# Patient Record
Sex: Female | Born: 1985 | Race: Black or African American | Hispanic: No | Marital: Married | Smoking: Former smoker
Health system: Southern US, Community
[De-identification: ages and names within clinical notes are randomized; demographics above are authoritative.]

## PROBLEM LIST (undated history)

## (undated) ENCOUNTER — Inpatient Hospital Stay (HOSPITAL_COMMUNITY): Payer: Self-pay

## (undated) HISTORY — PX: TYMPANOSTOMY TUBE PLACEMENT: SHX32

## (undated) HISTORY — PX: OTHER SURGICAL HISTORY: SHX169

## (undated) HISTORY — PX: REVISION OF SCAR ON FACE/HEAD: SHX2349

## (undated) HISTORY — PX: TONSILLECTOMY: SUR1361

---

## 2013-06-15 ENCOUNTER — Other Ambulatory Visit (HOSPITAL_COMMUNITY): Payer: Self-pay | Admitting: Physician Assistant

## 2013-06-15 DIAGNOSIS — Z3689 Encounter for other specified antenatal screening: Secondary | ICD-10-CM

## 2013-06-15 LAB — OB RESULTS CONSOLE ANTIBODY SCREEN: Antibody Screen: NEGATIVE

## 2013-06-15 LAB — OB RESULTS CONSOLE RPR: RPR: NONREACTIVE

## 2013-06-15 LAB — OB RESULTS CONSOLE RUBELLA ANTIBODY, IGM: Rubella: IMMUNE

## 2013-06-16 ENCOUNTER — Ambulatory Visit (HOSPITAL_COMMUNITY)
Admission: RE | Admit: 2013-06-16 | Discharge: 2013-06-16 | Disposition: A | Discharge status: Home / Self Care | Payer: Medicaid Other | Source: Ambulatory Visit | Attending: Physician Assistant | Admitting: Physician Assistant

## 2013-06-16 ENCOUNTER — Encounter (HOSPITAL_COMMUNITY): Payer: Self-pay

## 2013-06-16 DIAGNOSIS — Z1389 Encounter for screening for other disorder: Secondary | ICD-10-CM | POA: Insufficient documentation

## 2013-06-16 DIAGNOSIS — Z3689 Encounter for other specified antenatal screening: Secondary | ICD-10-CM

## 2013-06-16 DIAGNOSIS — Z363 Encounter for antenatal screening for malformations: Secondary | ICD-10-CM | POA: Insufficient documentation

## 2013-06-16 DIAGNOSIS — O358XX0 Maternal care for other (suspected) fetal abnormality and damage, not applicable or unspecified: Secondary | ICD-10-CM | POA: Insufficient documentation

## 2013-06-16 DIAGNOSIS — Z8751 Personal history of pre-term labor: Secondary | ICD-10-CM | POA: Insufficient documentation

## 2013-07-09 LAB — OB RESULTS CONSOLE GC/CHLAMYDIA
Chlamydia: NEGATIVE
Gonorrhea: NEGATIVE

## 2013-07-14 ENCOUNTER — Inpatient Hospital Stay (HOSPITAL_COMMUNITY)
Admission: AD | Admit: 2013-07-14 | Discharge: 2013-07-15 | Disposition: A | Discharge status: Home / Self Care | Payer: Medicaid Other | Source: Ambulatory Visit | Attending: Obstetrics & Gynecology | Admitting: Obstetrics & Gynecology

## 2013-07-14 ENCOUNTER — Emergency Department (HOSPITAL_COMMUNITY)
Admission: EM | Admit: 2013-07-14 | Discharge: 2013-07-14 | Discharge status: Against Medical Advice | Payer: Medicaid Other | Attending: Emergency Medicine | Admitting: Emergency Medicine

## 2013-07-14 ENCOUNTER — Encounter (HOSPITAL_COMMUNITY): Payer: Self-pay | Admitting: *Deleted

## 2013-07-14 DIAGNOSIS — R109 Unspecified abdominal pain: Secondary | ICD-10-CM | POA: Insufficient documentation

## 2013-07-14 DIAGNOSIS — R070 Pain in throat: Secondary | ICD-10-CM | POA: Insufficient documentation

## 2013-07-14 DIAGNOSIS — R112 Nausea with vomiting, unspecified: Secondary | ICD-10-CM

## 2013-07-14 DIAGNOSIS — Z5900 Homelessness unspecified: Secondary | ICD-10-CM | POA: Insufficient documentation

## 2013-07-14 DIAGNOSIS — O212 Late vomiting of pregnancy: Secondary | ICD-10-CM | POA: Insufficient documentation

## 2013-07-14 DIAGNOSIS — Z87891 Personal history of nicotine dependence: Secondary | ICD-10-CM | POA: Insufficient documentation

## 2013-07-14 NOTE — MAU Note (Signed)
PT SAY SHE GETS [facility] AT [facility] .  PT IS HOMELESS-  SLEEPS AT A HOTEL.    SHE WENT TO [place]    TO SIT IN LOBBY. AT 6 PM     ATE SANDWICH AT 1PM  AT [facility]  -  SHE FELT HOT, DRANK SOME WATER.     AT 3 PM AT LIBRARY SHE VOMITED  AND HAD UPPER ABD PAIN.   NO VAG BLEEDING.    SAYS HER THROAT AND HEAD HURTS.  DENIES UC'S.     VE  AT [facility] ON AUG- CLOSED .  DENIES HSV  AND MRSA.   SAYS GBS- NEG

## 2013-07-14 NOTE — ED Notes (Signed)
Pt c/o upper abd pain, nausea, and vomiting starting today after eating a sandwich from a homeless shelter. Pt is [redacted] weeks pregnant. Pt reports she has received prenatal care. Pt is refusing to see a female patient.

## 2013-07-14 NOTE — ED Notes (Addendum)
Pt requesting female staff only, they are refusing to speak to any female staff. They refused to allow Eston Esters RN to triage patient, they did allow Amedeo Gory EMT to obtain vital signs but informed him he was not speak to her. The spouse stated he allowed Thayer Ohm to do her vital signs but refused for him to touch her any other way.  They requested to be transferred to [facility], I informed pt we may transfer her but it would be up to a Dr. To make that decision.  Pt refused to be seen unless we could guarantee a female staff. I informed pt we had female PA's working tonight and we would be more than happy for the female PA to evaluate and treat pt.  Pt wanted to know why we didn't have all female Dr.'s, spouse stated "In [place] country we have all female Dr.'s to treat our women, Men are not allowed in the room, to look at our women to speak to our women, or to touch our women."  Pt again informed we would make every effort to treat pt by all female staff members to the best of our ability. Pt refused to stay because we didn't have female dr.'s working, she didn't just want a female PA.

## 2013-07-14 NOTE — MAU Note (Signed)
CALLED PT TO TRIAGE- SOMEONE IN LOBBY SAID SHE WAS IN B-ROOM IN LOBBY

## 2013-07-15 DIAGNOSIS — R112 Nausea with vomiting, unspecified: Secondary | ICD-10-CM

## 2013-07-15 MED ORDER — MENTHOL 3 MG MT LOZG: 1.0000 | LOZENGE | OROMUCOSAL | Status: DC | PRN

## 2013-07-15 MED ORDER — ONDANSETRON 8 MG PO TBDP
8.0000 mg | ORAL_TABLET | Freq: Once | ORAL | Status: AC
  Administered 2013-07-15: 8 mg via ORAL
  Filled 2013-07-15: qty 1

## 2013-07-15 MED ORDER — ONDANSETRON HCL 4 MG PO TABS: 8.0000 mg | ORAL_TABLET | Freq: Once | ORAL | Status: DC

## 2013-07-15 NOTE — MAU Provider Note (Signed)
  History     CSN: [id number]  Arrival date and time: 07/14/13 2304   None     No chief complaint on file.  HPI  Lacey Schneider is a 27 y.o. [phone] at [redacted]w[redacted]d who presents complaining of nausea and vomiting. States she ate a large [place] sandwich around lunchtime and afterwards started to feel unwell. Felt sweaty, nauseated. She vomited 5 times. Had some epigastric pain but does not have it currently. Since coming to the MAU has eaten one potato chip, which she has kept down. Last vomit was 2-3 hours ago. Complains of throat pain from having vomited.  Denies cramping/ctx, fluid leaking, vaginal bleeding, or decreased fetal movement.    OB History   Grav Para Term Preterm Abortions TAB SAB Ect Mult Living   4 3 2 1      3      History reviewed. No pertinent past medical history.  Past Surgical History  Procedure Laterality Date  . Tonsillectomy    . Addnoids    . Tympanostomy tube placement    . Revision of scar on face/head      History reviewed. No pertinent family history.  History  Substance Use Topics  . Smoking status: Former Games developer  . Smokeless tobacco: Not on file  . Alcohol Use: No    Allergies: No Known Allergies  Prescriptions prior to admission  Medication Sig Dispense Refill  . Multiple Vitamin (MULTIVITAMIN) tablet Take 1 tablet by mouth daily.        ROS  Denies cramping/ctx, fluid leaking, vaginal bleeding, or decreased fetal movement.   Physical Exam   Blood pressure 118/61, pulse 90, temperature 97.9 F (36.6 C), temperature source Oral, height 5\' 3"  (1.6 m), weight 222 lb (100.699 kg).  Physical Exam Gen: NAD HEENT: MMM Heart: RRR Lungs: CTAB, NWOB Abd: gravid but otherwise soft, nontender to palpation Ext: no appreciable lower extremity edema bilaterally Neuro: grossly nonfocal, speech intact  MAU Course  Procedures  Urine dipstick ordered but not done due to small volume of urine given by patient (had just urinated prior to giving  sample).  Given zofran ODT 8mg  x1, feeling better, less nauseated. Able to keep sprite down.  Assessment and Plan   Lacey Schneider is a 27 y.o. [phone] at [redacted]w[redacted]d who presents with nausea and vomiting after eating a sandwich earlier. Currently afebrile, normal vital signs. Tolerated PO challenge in MAU. Symptoms improved with zofran ODT. Will give Cepacol for throat pain. Stable for discharge at this time.  Levert Feinstein, MD Family Medicine PGY-2 07/15/2013, 2:13 AM   I have seen and examined this patient and I agree with the above. FHR 130-140s +accels, no decels. Rare ctx on toco without laborlike complaints. Cam Hai 7:18 AM 07/15/2013

## 2013-07-17 NOTE — MAU Provider Note (Signed)

## 2013-08-06 ENCOUNTER — Other Ambulatory Visit (HOSPITAL_COMMUNITY): Payer: Self-pay | Admitting: Physician Assistant

## 2013-08-06 DIAGNOSIS — O480 Post-term pregnancy: Secondary | ICD-10-CM

## 2013-08-09 ENCOUNTER — Encounter (HOSPITAL_COMMUNITY): Payer: Self-pay | Admitting: *Deleted

## 2013-08-09 ENCOUNTER — Inpatient Hospital Stay (HOSPITAL_COMMUNITY)
Admission: AD | Admit: 2013-08-09 | Discharge: 2013-08-13 | DRG: 765 | Disposition: A | Discharge status: Home / Self Care | Payer: Medicaid Other | Source: Ambulatory Visit | Attending: Obstetrics & Gynecology | Admitting: Obstetrics & Gynecology

## 2013-08-09 DIAGNOSIS — O480 Post-term pregnancy: Principal | ICD-10-CM | POA: Diagnosis present

## 2013-08-09 DIAGNOSIS — O411290 Chorioamnionitis, unspecified trimester, not applicable or unspecified: Secondary | ICD-10-CM | POA: Diagnosis not present

## 2013-08-09 DIAGNOSIS — O411090 Infection of amniotic sac and membranes, unspecified, unspecified trimester, not applicable or unspecified: Secondary | ICD-10-CM | POA: Diagnosis present

## 2013-08-09 LAB — CBC
Hemoglobin: 12.5 g/dL (ref 12.0–15.0)
MCV: 77.1 fL — ABNORMAL LOW (ref 78.0–100.0)
Platelets: 257 10*3/uL (ref 150–400)
RBC: 4.58 MIL/uL (ref 3.87–5.11)
WBC: 6.1 10*3/uL (ref 4.0–10.5)

## 2013-08-09 MED ORDER — IBUPROFEN 600 MG PO TABS: 600.0000 mg | ORAL_TABLET | Freq: Four times a day (QID) | ORAL | Status: DC | PRN

## 2013-08-09 MED ORDER — LIDOCAINE HCL (PF) 1 % IJ SOLN
30.0000 mL | INTRAMUSCULAR | Status: DC | PRN
  Filled 2013-08-09: qty 30

## 2013-08-09 MED ORDER — OXYTOCIN BOLUS FROM INFUSION: 500.0000 mL | INTRAVENOUS | Status: DC

## 2013-08-09 MED ORDER — FENTANYL CITRATE 0.05 MG/ML IJ SOLN
100.0000 ug | INTRAMUSCULAR | Status: DC | PRN
  Administered 2013-08-10 (×8): 100 ug via INTRAVENOUS
  Filled 2013-08-09 (×8): qty 2

## 2013-08-09 MED ORDER — FENTANYL BOLUS VIA INFUSION: 100.0000 ug | INTRAVENOUS | Status: DC | PRN

## 2013-08-09 MED ORDER — ONDANSETRON HCL 4 MG/2ML IJ SOLN: 4.0000 mg | Freq: Four times a day (QID) | INTRAMUSCULAR | Status: DC | PRN

## 2013-08-09 MED ORDER — ACETAMINOPHEN 325 MG PO TABS
650.0000 mg | ORAL_TABLET | ORAL | Status: DC | PRN
  Administered 2013-08-10: 650 mg via ORAL
  Filled 2013-08-09: qty 2

## 2013-08-09 MED ORDER — OXYTOCIN 40 UNITS IN LACTATED RINGERS INFUSION - SIMPLE MED: 62.5000 mL/h | INTRAVENOUS | Status: DC

## 2013-08-09 MED ORDER — LACTATED RINGERS IV SOLN
INTRAVENOUS | Status: DC
  Administered 2013-08-09 – 2013-08-10 (×3): via INTRAVENOUS
  Administered 2013-08-10 (×2): 125 mL/h via INTRAVENOUS
  Administered 2013-08-10: 21:00:00 via INTRAVENOUS

## 2013-08-09 MED ORDER — CITRIC ACID-SODIUM CITRATE 334-500 MG/5ML PO SOLN
30.0000 mL | ORAL | Status: DC | PRN
  Administered 2013-08-10: 30 mL via ORAL
  Filled 2013-08-09: qty 15

## 2013-08-09 MED ORDER — LACTATED RINGERS IV SOLN
500.0000 mL | INTRAVENOUS | Status: DC | PRN
  Administered 2013-08-10: 1000 mL via INTRAVENOUS

## 2013-08-09 MED ORDER — OXYCODONE-ACETAMINOPHEN 5-325 MG PO TABS: 1.0000 | ORAL_TABLET | ORAL | Status: DC | PRN

## 2013-08-09 MED ORDER — FLEET ENEMA 7-19 GM/118ML RE ENEM: 1.0000 | ENEMA | RECTAL | Status: DC | PRN

## 2013-08-09 NOTE — Progress Notes (Signed)
Lacey Schneider is a 27 y.o. [phone] at [redacted]w[redacted]d admitted for induction of labor due to Post dates. Due date 08/01/2013.  Subjective: Doing well. Feeling some contractions but they are mild in intensity. Husband in the room.   Objective: BP 113/69   Pulse 95   Temp(Src) 99.1 F (37.3 C) (Oral)   Resp 18   Ht 5\' 3"  (1.6 m)   Wt 98.884 kg (218 lb)   BMI 38.63 kg/m2     FHT:  FHR: 130 bpm, variability: moderate,  accelerations:  Present,  decelerations:  Absent UC:   irregular, every 5-8 minutes SVE:   Dilation: 1 Effacement (%): 50 Station: -3 Exam by:: V. Irving  Labs: No results found for this basename: WBC, HGB, HCT, MCV, PLT  See H&P - routine OB labs normal  Assessment / Plan: Induction of labor due to postterm,  progressing well on pitocin  Labor: augmenting with foley bulb (placed) Preeclampsia:  n/a Fetal Wellbeing:  Category I Pain Control:  Labor support without medications and Fentanyl I/D:  n/a Anticipated MOD:  NSVD  Lacey Schneider 08/09/2013, 9:12 PM

## 2013-08-09 NOTE — H&P (Signed)
Lacey Schneider is a 27 y.o. female [phone] with IUP at [redacted]w[redacted]d by unsure LMP c/w 32 week [place] presenting for r/o labor. Pt states has been having regular contractions since 9pm last night. Have worsened in the last 4 hours.  No VB, LOF. +FM.    PNCare at [facility]. Has been uncomplicated thus far. GBS neg. Normal anatomy scan. Normal 1 hour glucola.   Prenatal History/Complications:  Past Medical History: No past medical history on file.  Past Surgical History: Past Surgical History  Procedure Laterality Date  . Tonsillectomy    . Addnoids    . Tympanostomy tube placement    . Revision of scar on face/head      Obstetrical History: OB History   Grav Para Term Preterm Abortions TAB SAB Ect Mult Living   4 3 2 1      3        Social History: History   Social History  . Marital Status: Married    Spouse Name: N/A    Number of Children: N/A  . Years of Education: N/A   Social History Main Topics  . Smoking status: Former Games developer  . Smokeless tobacco: Never Used  . Alcohol Use: No  . Drug Use: No  . Sexual Activity: Yes    Birth Control/ Protection: None   Other Topics Concern  . Not on file   Social History Narrative  . No narrative on file    Family History: No family history on file.  Allergies: No Known Allergies  Prescriptions prior to admission  Medication Sig Dispense Refill  . menthol-cetylpyridinium (CEPACOL) 3 MG lozenge Take 1 lozenge (3 mg total) by mouth as needed.      . Multiple Vitamin (MULTIVITAMIN) tablet Take 1 tablet by mouth daily.         Review of Systems   Constitutional: Negative for fever, chills, weight loss, malaise/fatigue and diaphoresis.  HENT: Negative for hearing loss, ear pain, nosebleeds, congestion, sore throat, neck pain, tinnitus and ear discharge.   Eyes: Negative for blurred vision, double vision, photophobia, pain, discharge and redness.  Respiratory: Negative for cough, hemoptysis, sputum production, shortness of breath,  wheezing and stridor.   Cardiovascular: Negative for chest pain, palpitations, orthopnea,  leg swelling  Gastrointestinal: Positive for abdominal pain. Negative for heartburn, nausea, vomiting, diarrhea, constipation, blood in stool Genitourinary: Negative for dysuria, urgency, frequency, hematuria and flank pain.  Musculoskeletal: Negative for myalgias, back pain, joint pain and falls.  Skin: Negative for itching and rash.  Neurological: Negative for dizziness, tingling, tremors, sensory change, speech change, focal weakness, seizures, loss of consciousness, weakness and headaches.  Endo/Heme/Allergies: Negative for environmental allergies and polydipsia. Does not bruise/bleed easily.  Psychiatric/Behavioral: Negative for depression, suicidal ideas, hallucinations, memory loss and substance abuse. The patient is not nervous/anxious and does not have insomnia.       Blood pressure 113/69, pulse 95, temperature 99.1 F (37.3 C), temperature source Oral, resp. rate 18, height 5\' 3"  (1.6 m), weight 98.884 kg (218 lb). General appearance: alert, cooperative and no distress Lungs: clear to auscultation bilaterally Heart: regular rate and rhythm Abdomen: soft, non-tender; bowel sounds normal Pelvic: 1.5/50/-3 Extremities: Homans sign is negative, no sign of DVT DTR's 2+  Presentation: cephalic confirmed on US Fetal monitoringBaseline: 150 bpm, Variability: Good {> 6 bpm), Accelerations: Reactive and Decelerations: Absent Uterine activity every 3-29min  Dilation: 1.5 Effacement (%): 50 Station: -3 Exam by:: Dr. Reola Calkins   Prenatal labs: ABO, Rh: O/Positive/-- (07/21 0000) Antibody: Negative (  07/21 0000) Rubella:   RPR: Nonreactive (07/21 0000)  HBsAg: Negative (07/21 0000)  HIV: Non-reactive (07/21 0000)  GBS: Negative (08/14 0000)  1 hr Glucola nml Genetic screening  Too late Anatomy US normal    Assessment: Lacey Schneider is a 27 y.o. [phone] with an IUP at [redacted]w[redacted]d presenting for r/o  labor at postdates.   Plan: 1) admit to L&D - routine orders - plan to augment with foley bulb and pit - epidural prn  2) FWB - cat I tracing - GBS neg - EFW 6#  3) anticipate SVD   Lacey Schneider L, MD 08/09/2013, 7:30 PM

## 2013-08-10 ENCOUNTER — Ambulatory Visit (HOSPITAL_COMMUNITY): Admission: RE | Admit: 2013-08-10 | Payer: Medicaid Other | Source: Ambulatory Visit

## 2013-08-10 ENCOUNTER — Encounter (HOSPITAL_COMMUNITY): Payer: Self-pay

## 2013-08-10 ENCOUNTER — Inpatient Hospital Stay (HOSPITAL_COMMUNITY): Payer: Medicaid Other | Admitting: Anesthesiology

## 2013-08-10 ENCOUNTER — Encounter (HOSPITAL_COMMUNITY)
Admission: AD | Disposition: A | Discharge status: Home / Self Care | Payer: Self-pay | Source: Ambulatory Visit | Attending: Obstetrics & Gynecology

## 2013-08-10 ENCOUNTER — Encounter (HOSPITAL_COMMUNITY): Payer: Self-pay | Admitting: Anesthesiology

## 2013-08-10 DIAGNOSIS — O480 Post-term pregnancy: Secondary | ICD-10-CM

## 2013-08-10 DIAGNOSIS — O411290 Chorioamnionitis, unspecified trimester, not applicable or unspecified: Secondary | ICD-10-CM | POA: Diagnosis not present

## 2013-08-10 DIAGNOSIS — O411090 Infection of amniotic sac and membranes, unspecified, unspecified trimester, not applicable or unspecified: Secondary | ICD-10-CM

## 2013-08-10 LAB — ABO/RH: ABO/RH(D): O POS

## 2013-08-10 LAB — TYPE AND SCREEN: ABO/RH(D): O POS

## 2013-08-10 LAB — RAPID URINE DRUG SCREEN, HOSP PERFORMED: Benzodiazepines: NOT DETECTED

## 2013-08-10 SURGERY — Surgical Case
Anesthesia: Spinal | Site: Abdomen | Wound class: Clean Contaminated

## 2013-08-10 MED ORDER — SODIUM CHLORIDE 0.9 % IV SOLN
2.0000 g | Freq: Four times a day (QID) | INTRAVENOUS | Status: DC
  Administered 2013-08-10 (×2): 2 g via INTRAVENOUS
  Filled 2013-08-10 (×5): qty 2000

## 2013-08-10 MED ORDER — ONDANSETRON HCL 4 MG/2ML IJ SOLN
INTRAMUSCULAR | Status: AC
  Filled 2013-08-10: qty 2

## 2013-08-10 MED ORDER — FENTANYL CITRATE 0.05 MG/ML IJ SOLN
25.0000 ug | INTRAMUSCULAR | Status: DC | PRN
  Administered 2013-08-10 – 2013-08-11 (×4): 50 ug via INTRAVENOUS

## 2013-08-10 MED ORDER — DIPHENHYDRAMINE HCL 50 MG/ML IJ SOLN: 12.5000 mg | INTRAMUSCULAR | Status: DC | PRN

## 2013-08-10 MED ORDER — KETOROLAC TROMETHAMINE 30 MG/ML IJ SOLN
INTRAMUSCULAR | Status: AC
  Filled 2013-08-10: qty 1

## 2013-08-10 MED ORDER — ONDANSETRON HCL 4 MG/2ML IJ SOLN
INTRAMUSCULAR | Status: DC | PRN
  Administered 2013-08-10: 4 mg via INTRAVENOUS

## 2013-08-10 MED ORDER — SCOPOLAMINE 1 MG/3DAYS TD PT72
1.0000 | MEDICATED_PATCH | Freq: Once | TRANSDERMAL | Status: DC
  Administered 2013-08-10: 1.5 mg via TRANSDERMAL

## 2013-08-10 MED ORDER — MORPHINE SULFATE (PF) 0.5 MG/ML IJ SOLN
INTRAMUSCULAR | Status: DC | PRN
  Administered 2013-08-10: .15 mg via INTRATHECAL

## 2013-08-10 MED ORDER — EPHEDRINE SULFATE 50 MG/ML IJ SOLN
INTRAMUSCULAR | Status: DC | PRN
  Administered 2013-08-10 (×2): 10 mg via INTRAVENOUS

## 2013-08-10 MED ORDER — DEXTROSE 5 % IV SOLN
1.5000 mg/kg | Freq: Three times a day (TID) | INTRAVENOUS | Status: DC
  Administered 2013-08-10: 150 mg via INTRAVENOUS
  Filled 2013-08-10 (×4): qty 3.71

## 2013-08-10 MED ORDER — CEFAZOLIN SODIUM-DEXTROSE 2-3 GM-% IV SOLR
INTRAVENOUS | Status: DC | PRN
  Administered 2013-08-10: 2 g via INTRAVENOUS

## 2013-08-10 MED ORDER — PHENYLEPHRINE 40 MCG/ML (10ML) SYRINGE FOR IV PUSH (FOR BLOOD PRESSURE SUPPORT)
PREFILLED_SYRINGE | INTRAVENOUS | Status: AC
  Filled 2013-08-10: qty 10

## 2013-08-10 MED ORDER — FENTANYL CITRATE 0.05 MG/ML IJ SOLN
INTRAMUSCULAR | Status: AC
  Filled 2013-08-10: qty 2

## 2013-08-10 MED ORDER — OXYTOCIN 10 UNIT/ML IJ SOLN
40.0000 [IU] | INTRAVENOUS | Status: DC | PRN
  Administered 2013-08-10: 40 [IU] via INTRAVENOUS

## 2013-08-10 MED ORDER — EPHEDRINE 5 MG/ML INJ
INTRAVENOUS | Status: AC
  Filled 2013-08-10: qty 10

## 2013-08-10 MED ORDER — MEPERIDINE HCL 25 MG/ML IJ SOLN: 6.2500 mg | INTRAMUSCULAR | Status: DC | PRN

## 2013-08-10 MED ORDER — SCOPOLAMINE 1 MG/3DAYS TD PT72
MEDICATED_PATCH | TRANSDERMAL | Status: AC
  Filled 2013-08-10: qty 1

## 2013-08-10 MED ORDER — EPHEDRINE 5 MG/ML INJ: 10.0000 mg | INTRAVENOUS | Status: DC | PRN

## 2013-08-10 MED ORDER — LACTATED RINGERS IV SOLN: 500.0000 mL | Freq: Once | INTRAVENOUS | Status: DC

## 2013-08-10 MED ORDER — MEPERIDINE HCL 25 MG/ML IJ SOLN
INTRAMUSCULAR | Status: DC | PRN
  Administered 2013-08-10 (×3): 6 mg via INTRAVENOUS
  Administered 2013-08-10: 7 mg via INTRAVENOUS

## 2013-08-10 MED ORDER — CEFAZOLIN SODIUM-DEXTROSE 2-3 GM-% IV SOLR
INTRAVENOUS | Status: AC
  Filled 2013-08-10: qty 50

## 2013-08-10 MED ORDER — ZOLPIDEM TARTRATE 5 MG PO TABS
5.0000 mg | ORAL_TABLET | Freq: Once | ORAL | Status: AC
  Administered 2013-08-10: 5 mg via ORAL
  Filled 2013-08-10: qty 1

## 2013-08-10 MED ORDER — OXYTOCIN 10 UNIT/ML IJ SOLN
INTRAMUSCULAR | Status: AC
  Filled 2013-08-10: qty 4

## 2013-08-10 MED ORDER — FENTANYL 2.5 MCG/ML BUPIVACAINE 1/10 % EPIDURAL INFUSION (WH - ANES): 14.0000 mL/h | INTRAMUSCULAR | Status: DC | PRN

## 2013-08-10 MED ORDER — PHENYLEPHRINE 40 MCG/ML (10ML) SYRINGE FOR IV PUSH (FOR BLOOD PRESSURE SUPPORT): 80.0000 ug | PREFILLED_SYRINGE | INTRAVENOUS | Status: DC | PRN

## 2013-08-10 MED ORDER — MEPERIDINE HCL 25 MG/ML IJ SOLN
INTRAMUSCULAR | Status: AC
  Filled 2013-08-10: qty 1

## 2013-08-10 MED ORDER — OXYTOCIN 40 UNITS IN LACTATED RINGERS INFUSION - SIMPLE MED
1.0000 m[IU]/min | INTRAVENOUS | Status: DC
  Administered 2013-08-10: 2 m[IU]/min via INTRAVENOUS
  Filled 2013-08-10: qty 1000

## 2013-08-10 MED ORDER — FENTANYL CITRATE 0.05 MG/ML IJ SOLN
INTRAMUSCULAR | Status: DC | PRN
  Administered 2013-08-10: 25 ug via INTRATHECAL

## 2013-08-10 MED ORDER — TERBUTALINE SULFATE 1 MG/ML IJ SOLN: 0.2500 mg | Freq: Once | INTRAMUSCULAR | Status: AC | PRN

## 2013-08-10 MED ORDER — PHENYLEPHRINE HCL 10 MG/ML IJ SOLN
INTRAMUSCULAR | Status: DC | PRN
  Administered 2013-08-10 (×2): 80 ug via INTRAVENOUS
  Administered 2013-08-10 (×2): 40 ug via INTRAVENOUS
  Administered 2013-08-10 (×2): 80 ug via INTRAVENOUS

## 2013-08-10 MED ORDER — BUPIVACAINE IN DEXTROSE 0.75-8.25 % IT SOLN
INTRATHECAL | Status: DC | PRN
  Administered 2013-08-10: 1.5 mL via INTRATHECAL

## 2013-08-10 MED ORDER — KETOROLAC TROMETHAMINE 30 MG/ML IJ SOLN: 30.0000 mg | Freq: Four times a day (QID) | INTRAMUSCULAR | Status: AC | PRN

## 2013-08-10 MED ORDER — MORPHINE SULFATE 0.5 MG/ML IJ SOLN
INTRAMUSCULAR | Status: AC
  Filled 2013-08-10: qty 10

## 2013-08-10 MED ORDER — 0.9 % SODIUM CHLORIDE (POUR BTL) OPTIME
TOPICAL | Status: DC | PRN
  Administered 2013-08-10: 1000 mL

## 2013-08-10 MED ORDER — LACTATED RINGERS IV SOLN
INTRAVENOUS | Status: DC | PRN
  Administered 2013-08-10: 21:00:00 via INTRAVENOUS

## 2013-08-10 MED ORDER — KETOROLAC TROMETHAMINE 30 MG/ML IJ SOLN
30.0000 mg | Freq: Four times a day (QID) | INTRAMUSCULAR | Status: AC | PRN
  Administered 2013-08-10: 30 mg via INTRAVENOUS

## 2013-08-10 SURGICAL SUPPLY — 30 items
CLAMP CORD UMBIL (MISCELLANEOUS) IMPLANT
CLEANER TIP ELECTROSURG 2X2 (MISCELLANEOUS) ×2 IMPLANT
CONTAINER PREFILL 10% NBF 15ML (MISCELLANEOUS) IMPLANT
DEVICE BLD TRNS LUER ATTCH (MISCELLANEOUS) ×2 IMPLANT
DRAPE LG THREE QUARTER DISP (DRAPES) ×4 IMPLANT
DRSG OPSITE POSTOP 4X10 (GAUZE/BANDAGES/DRESSINGS) ×2 IMPLANT
DURAPREP 26ML APPLICATOR (WOUND CARE) ×2 IMPLANT
ELECT REM PT RETURN 9FT ADLT (ELECTROSURGICAL) ×2
ELECTRODE REM PT RTRN 9FT ADLT (ELECTROSURGICAL) ×1 IMPLANT
EXTRACTOR VACUUM M CUP 4 TUBE (SUCTIONS) IMPLANT
GLOVE BIOGEL PI IND STRL 6.5 (GLOVE) ×1 IMPLANT
GLOVE BIOGEL PI INDICATOR 6.5 (GLOVE) ×1
GLOVE SURG SS PI 6.0 STRL IVOR (GLOVE) ×2 IMPLANT
GOWN PREVENTION PLUS XLARGE (GOWN DISPOSABLE) ×4 IMPLANT
GOWN STRL REIN XL XLG (GOWN DISPOSABLE) ×4 IMPLANT
KIT ABG SYR 3ML LUER SLIP (SYRINGE) IMPLANT
NEEDLE HYPO 25X5/8 SAFETYGLIDE (NEEDLE) IMPLANT
NS IRRIG 1000ML POUR BTL (IV SOLUTION) ×2 IMPLANT
PACK C SECTION WH (CUSTOM PROCEDURE TRAY) ×2 IMPLANT
PAD OB MATERNITY 4.3X12.25 (PERSONAL CARE ITEMS) ×2 IMPLANT
PENCIL BUTTON HOLSTER BLD 10FT (ELECTRODE) ×2 IMPLANT
RTRCTR C-SECT PINK 25CM LRG (MISCELLANEOUS) IMPLANT
SEPRAFILM MEMBRANE 5X6 (MISCELLANEOUS) IMPLANT
STAPLER VISISTAT 35W (STAPLE) IMPLANT
SUT PLAIN 0 NONE (SUTURE) IMPLANT
SUT VIC AB 0 CT1 36 (SUTURE) ×8 IMPLANT
SUT VIC AB 4-0 KS 27 (SUTURE) ×2 IMPLANT
TOWEL OR 17X24 6PK STRL BLUE (TOWEL DISPOSABLE) ×2 IMPLANT
TRAY FOLEY CATH 14FR (SET/KITS/TRAYS/PACK) ×2 IMPLANT
WATER STERILE IRR 1000ML POUR (IV SOLUTION) ×2 IMPLANT

## 2013-08-10 NOTE — Progress Notes (Signed)
I received a consult to see [name].  I spoke with her RN who made me aware that patient reported a request for female providers only due to religious concerns.  I made an initial contact with [name] and her husband at a time when [name] was beginning to feel some pain--the visit was short as a result.  The religious issue of female-only providers did not come up in conversation, but patient and family are aware of on-going availability of chaplain support.  Spiritual Care will attempt to follow up with them during their stay, but please also page as needs arise related to spiritual and emotional care.  [employer] Pager, [phone] 12:22 PM   08/10/13 1200  Clinical Encounter Type  Visited With Patient and family together  Visit Type Spiritual support;Initial  Referral From Nurse

## 2013-08-10 NOTE — Progress Notes (Signed)
I examined pt and agree with documentation above and resident plan of care. MUHAMMAD,Zlaty Alexa  

## 2013-08-10 NOTE — Progress Notes (Signed)
Dr. Jolayne Panther and Misty Stanley, CNM discussed POC with pt and family, due to pt's concerns and being anxious about the length of labor.  Pt and family verbalized understandings.  Will continue to monitor.

## 2013-08-10 NOTE — Progress Notes (Signed)
Aneth Schlagel is a 27 y.o. [phone] at [redacted]w[redacted]d admitted for induction of labor due to Post dates.   Subjective: Pt breathing with contractions, using IV pain medication, family in room for support  Objective: BP 130/82  Pulse 76  Temp(Src) 98.2 F (36.8 C) (Oral)  Resp 18  Ht 5\' 3"  (1.6 m)  Wt 98.884 kg (218 lb)  BMI 38.63 kg/m2      FHT:  FHR: 150 bpm, variability: moderate,  accelerations:  Present,  decelerations:  Present repetitive early decels and variable x1 UC:   regular, every 3 minutes SVE:   Dilation: 5.5 Effacement (%): 60 Station: -2 Exam by:: Misty Stanley, CNM  Labs: Lab Results  Component Value Date   WBC 6.1 08/09/2013   HGB 12.5 08/09/2013   HCT 35.3* 08/09/2013   MCV 77.1* 08/09/2013   PLT 257 08/09/2013    Assessment / Plan: Spontaneous labor, progressing normally S/P Foley bulb  Labor: Progressing normally Hold on starting Pitocin at this time Preeclampsia:  n/a Fetal Wellbeing:  Category I overall, with variable x1 Pain Control:  Fentanyl I/D:  n/a Anticipated MOD:  NSVD  LEFTWICH-KIRBY, Dreshaun Stene 08/10/2013, 12:23 PM

## 2013-08-10 NOTE — Progress Notes (Signed)
Dr Constant notified of FHR, and that pt has agreed to c-section.  Dr Jolayne Panther to notify OR, and to report to pt bedside for consent.  RN will continue to remain at pt bedside for emotional support, answer questions, and prep for OR.

## 2013-08-10 NOTE — Progress Notes (Signed)
Lacey Schneider, CNM explained POC to pt and family.  Pt and family expressed concerns about length of labor and POC.  Pt and family verbalized understandings.  Will continue to monitor pt concerns.

## 2013-08-10 NOTE — Progress Notes (Signed)
Lacey Schneider is a 27 y.o. [phone] at [redacted]w[redacted]d admitted for induction of labor due to Post dates.  Subjective: Doing well. Contractions more painful now. Has needed fentanyl for pain. Foley bulb still in place. Husband at bedside.  Objective: BP 113/69   Pulse 95   Temp(Src) 99.1 F (37.3 C) (Oral)   Resp 18   Ht 5\' 3"  (1.6 m)   Wt 98.884 kg (218 lb)   BMI 38.63 kg/m2      FHT:  FHR: 130 bpm, variability: moderate,  accelerations:  Present,  decelerations:  Absent UC:   regular, every 2-3 minutes SVE:   Dilation: 1 Effacement (%): 50 Station: -3 Exam by:: V. Irving  Labs: Lab Results  Component Value Date   WBC 6.1 08/09/2013   HGB 12.5 08/09/2013   HCT 35.3* 08/09/2013   MCV 77.1* 08/09/2013   PLT 257 08/09/2013    Assessment / Plan: Induction of labor due to postdates  Labor: pogressing with foley bulb, will start pitocin once foley bulb falls out and recheck cervix Preeclampsia:  n/a Fetal Wellbeing:  Category I Pain Control:  Fentanyl I/D:  n/a Anticipated MOD:  NSVD  Vanna K Irving 08/10/2013, 1:22 AM

## 2013-08-10 NOTE — Anesthesia Procedure Notes (Signed)
Spinal  Patient location during procedure: OR Start time: 08/10/2013 8:52 PM Staffing Anesthesiologist: Devonia Farro A. Performed by: anesthesiologist  Preanesthetic Checklist Completed: patient identified, site marked, surgical consent, pre-op evaluation, timeout performed, IV checked, risks and benefits discussed and monitors and equipment checked Spinal Block Patient position: sitting Prep: site prepped and draped and DuraPrep Patient monitoring: heart rate, cardiac monitor, continuous pulse ox and blood pressure Approach: midline Location: L3-4 Injection technique: single-shot Needle Needle type: Sprotte  Needle gauge: 24 G Needle length: 9 cm Needle insertion depth: 5 cm Assessment Sensory level: T4 Additional Notes Patient tolerated procedure well. Adequate sensory level.

## 2013-08-10 NOTE — Op Note (Signed)
Lacey Schneider PROCEDURE DATE: 08/09/2013 - 08/10/2013  PREOPERATIVE DIAGNOSIS: Intrauterine pregnancy at  [redacted]w[redacted]d weeks gestation; non-reassuring fetal status  POSTOPERATIVE DIAGNOSIS: The same  PROCEDURE:     Cesarean Section  SURGEON:  Dr. Catalina Antigua  ASSISTANT: Dr. Reola Calkins  INDICATIONS: Lacey Schneider is a 27 y.o. [phone] at [redacted]w[redacted]d scheduled for cesarean section secondary to non-reassuring fetal status.  Patient with chorioamnionitis, repetitive late deceleration and no cervical change. The risks of cesarean section discussed with the patient included but were not limited to: bleeding which may require transfusion or reoperation; infection which may require antibiotics; injury to bowel, bladder, ureters or other surrounding organs; injury to the fetus; need for additional procedures including hysterectomy in the event of a life-threatening hemorrhage; placental abnormalities wth subsequent pregnancies, incisional problems, thromboembolic phenomenon and other postoperative/anesthesia complications. The patient concurred with the proposed plan, giving informed written consent for the procedure.    FINDINGS:  Viable female/female infant in cephalic presentation.  Apgars 3 and 8.  Thick meconium.  Intact placenta, three vessel cord.  Normal uterus, fallopian tubes and ovaries bilaterally.  ANESTHESIA:    Spinal INTRAVENOUS FLUIDS:3200 ml ESTIMATED BLOOD LOSS: 800 ml URINE OUTPUT:  125 ml SPECIMENS: Placenta sent to pathology COMPLICATIONS: None immediate  PROCEDURE IN DETAIL:  The patient received intravenous antibiotics and had sequential compression devices applied to her lower extremities while in the preoperative area.  She was then taken to the operating room where anesthesia was induced and was found to be adequate. A foley catheter was placed into her bladder and attached to Caramia Boutin gravity. She was then placed in a dorsal supine position with a leftward tilt, and prepped and draped in a  sterile manner. After an adequate timeout was performed, a Pfannenstiel skin incision was made with scalpel and carried through to the underlying layer of fascia. The fascia was incised in the midline and this incision was extended bilaterally using the Mayo scissors. Kocher clamps were applied to the superior aspect of the fascial incision and the underlying rectus muscles were dissected off bluntly. A similar process was carried out on the inferior aspect of the facial incision. The rectus muscles were separated in the midline bluntly and the peritoneum was entered bluntly. The Alexis self-retaining retractor was introduced into the abdominal cavity. Attention was turned to the lower uterine segment where a bladder flap was created, and a transverse hysterotomy was made with a scalpel and extended bilaterally bluntly. The infant was successfully delivered, and cord was clamped and cut and infant was handed over to awaiting neonatology team. Uterine massage was then administered and the placenta delivered intact with three-vessel cord. The uterus was cleared of clot and debris.  The hysterotomy was closed with 0 Vicryl in a running locked fashion, and an imbricating layer was also placed with a 0 Vicryl. Overall, excellent hemostasis was noted. The pelvis copiously irrigated and cleared of all clot and debris. Hemostasis was confirmed on all surfaces.  The fascia was then closed using 0 Vicryl.  The skin was closed in a subcuticular fashion using 3.0 Vicryl. The patient tolerated the procedure well. Sponge, lap, instrument and needle counts were correct x 2. She was taken to the recovery room in stable condition.    Jaiveer Panas,PEGGYMD  08/10/2013 9:48 PM

## 2013-08-10 NOTE — Progress Notes (Signed)
I examined pt and agree with documentation above and resident plan of care. MUHAMMAD,WALIDAH  

## 2013-08-10 NOTE — Progress Notes (Signed)
RN discussed with pt and husband the current plan of care, and how the plan may change if late decelerations continue to occur, due to fetal compromise.  Pt remains frustrated and is continuing to verbalize that "this is all because of what they did to me", referring to the foley bulb placement and the fetal monitors "being moved around too much".  She also verbalized fears that she will not bond with baby if C-section occurs.  Again, RN reiterated current plan of care and hypothetical situations if FHR continues in same manner.  RN also explained skin to skin protocol in OR.  RN left pt and husband to discuss plan.  Pt to call out when ready for RN to return.

## 2013-08-10 NOTE — Progress Notes (Signed)
Lacey Schneider is a 27 y.o. [phone] at [redacted]w[redacted]d admitted for induction of labor due to Post dates.   Subjective: Pt breathing with contractions.  IV pain medication helping per pt.  Pt became upset with provider when decelerations and possible C/S were discussed.  Pt upset that she has infection and is concerned about the cause of this.  S/O in the room and has questions about chorioamnionitis.  Pt does not want C/S and believes she can have the baby vaginally but does state understanding that provider must recommend safest option for delivery if problems on fetal monitor.    Objective: BP 121/72  Pulse 96  Temp(Src) 98.5 F (36.9 C) (Axillary)  Resp 20  Ht 5\' 3"  (1.6 m)  Wt 98.884 kg (218 lb)  BMI 38.63 kg/m2  SpO2 100%      FHT:  FHR: 155 bpm, variability: moderate,  accelerations:  Abscent,  decelerations:  Present early, late, and prolonged x1 lasting 13 minutes down to 90s UC:   regular, every 6 minutes SVE:   Dilation: 6 Effacement (%): 70 Station: -2 Exam by:: Misty Stanley, CNM  Maternal fever of 103 reported by RN ~ 1pm.  Ampicillin 2g IV Q 6 hours and Gentamycin 1.5g/kg Q 8 hours started and Tylenol given.     Pitocin started after Foley bulb out, FHR decelerations (lates) began shortly after Pitocin started.  Pitocin off. Dr Jolayne Panther to bedside x2 r/t FHR tracing.  Decelerations improve/resolve with O2, position change, fluid bolus.  Pt intolerant of position change, questioning all suggestions by RN and provider.  CNM in room multiple times to discuss importance of position change to improve fetal oxygenation.  Pt compliant each time after explanation/encouragement.    Pt and S/O declined IUPC and amnioinfusion at 7 pm today, reporting that Foley bulb started all of the problems and they do not want anything else inserted.  FHR tracing with moderate variability and no decelerations while CNM in room.  Ok to continue current course.  Pitocin remains off.  Cervix unchanged, 6/60/-2,  since  10 am this morning.   Labs: Lab Results  Component Value Date   WBC 6.1 08/09/2013   HGB 12.5 08/09/2013   HCT 35.3* 08/09/2013   MCV 77.1* 08/09/2013   PLT 257 08/09/2013    Assessment / Plan: Induction of labor due to postterm S/P foley bulb Chorioamnionitis  Labor: Prolonged labor, unable to continue Pitocin r/t decelerations Preeclampsia:  n/a Fetal Wellbeing:  Category II Pain Control:  Fentanyl Anticipated MOD:  unknown  LEFTWICH-KIRBY, Lacey Schneider 08/10/2013, 7:41 PM

## 2013-08-10 NOTE — Progress Notes (Signed)
1716-1720 FHR 120-150 bpm

## 2013-08-10 NOTE — Progress Notes (Addendum)
Patient ID: Lacey Schneider, female   DOB: [birth year], 27 y.o.   MRN: [id number] Long discussion with patient and her husband regarding fetal status and current plan of care. Discussed and explained occurrence of repetitive late decelerations and its impact on the fetus. Also explained why augmentation of labor with pitocin cannot be continued. Patient visually frustrated and blames placement of cervical foley for her poor progression. Patient threaten a law suit if a cesarean section were to occur as she feels she was poorly managed from the start of her induction.  Expressed my concerns regarding fetal heart tracing, also discussed need for cesarean delivery if pattern persists, in order to ensure best fetal outcome. Patient and her husband verbalized understanding but not interested in cesarean delivery at this time. Discussed plan to restart pitocin when tracing improves. Encouraged her to stay in lateral position with O2. Patient was counseled in the presence of her nurse.  15 minutes later. Received a call from nurse that patient agreed to cesarean section. Risks, benefits and alternatives were explained including but not limited to risks of bleeding, infection and damage to adjacent organs. Patient verbalized understanding and all questions were answered. Consent signed.

## 2013-08-10 NOTE — Progress Notes (Signed)
08/10/13 2015  Provider Notification  Provider Name/Title Constant, Peggy MD  Dr Jolayne Panther explained risks and benefits of c-section for fetal intolerance of labor.  Pt verbalized understanding and signed consent.

## 2013-08-10 NOTE — Progress Notes (Signed)
1610-9604 FHR decreased to 70-90 bpm

## 2013-08-10 NOTE — Transfer of Care (Signed)
Immediate Anesthesia Transfer of Care Note  Patient: Lacey Schneider  Procedure(s) Performed: Procedure(s): Primary CESAREAN SECTION of baby  girl at 2107 APGAR 3/8  (N/A)  Patient Location: PACU  Anesthesia Type:Spinal  Level of Consciousness: awake, sedated and patient cooperative  Airway & Oxygen Therapy: Patient Spontanous Breathing  Post-op Assessment: Report given to PACU RN and Post -op Vital signs reviewed and stable  Post vital signs: Reviewed and stable  Complications: No apparent anesthesia complications

## 2013-08-10 NOTE — Anesthesia Postprocedure Evaluation (Signed)
  Anesthesia Post-op Note  Patient: Lacey Schneider  Procedure(s) Performed: Procedure(s): Primary CESAREAN SECTION of baby  girl at 2107 APGAR 3/8  (N/A)  Patient Location: PACU  Anesthesia Type:Spinal  Level of Consciousness: awake, alert  and oriented  Airway and Oxygen Therapy: Patient Spontanous Breathing  Post-op Pain: none  Post-op Assessment: Post-op Vital signs reviewed, Patient's Cardiovascular Status Stable, Respiratory Function Stable, Patent Airway, No signs of Nausea or vomiting, Pain level controlled, No headache and No backache  Post-op Vital Signs: Reviewed and stable  Complications: No apparent anesthesia complications

## 2013-08-10 NOTE — Anesthesia Preprocedure Evaluation (Signed)
Anesthesia Evaluation  Patient identified by MRN, date of birth, ID band Patient awake    Reviewed: Allergy & Precautions, H&P , NPO status , Patient's Chart, lab work & pertinent test results  Airway Mallampati: III TM Distance: >3 FB Neck ROM: Full    Dental no notable dental hx. (+) Teeth Intact   Pulmonary former smoker,  breath sounds clear to auscultation  Pulmonary exam normal       Cardiovascular negative cardio ROS  Rhythm:Regular Rate:Normal     Neuro/Psych negative neurological ROS  negative psych ROS   GI/Hepatic GERD-  ,  Endo/Other  negative endocrine ROS  Renal/GU negative Renal ROS  negative genitourinary   Musculoskeletal negative musculoskeletal ROS (+)   Abdominal (+) + obese,   Peds  Hematology negative hematology ROS (+)   Anesthesia Other Findings   Reproductive/Obstetrics (+) Pregnancy Chorioamnionitis Post term Non-reassuring FHR                           Anesthesia Physical Anesthesia Plan  ASA: II and emergent  Anesthesia Plan: Spinal   Post-op Pain Management:    Induction:   Airway Management Planned: Natural Airway  Additional Equipment:   Intra-op Plan:   Post-operative Plan:   Informed Consent: I have reviewed the patients History and Physical, chart, labs and discussed the procedure including the risks, benefits and alternatives for the proposed anesthesia with the patient or authorized representative who has indicated his/her understanding and acceptance.     Plan Discussed with: Anesthesiologist, CRNA and Surgeon  Anesthesia Plan Comments:         Anesthesia Quick Evaluation

## 2013-08-11 ENCOUNTER — Encounter (HOSPITAL_COMMUNITY): Payer: Self-pay | Admitting: *Deleted

## 2013-08-11 LAB — CBC
HCT: 27.3 % — ABNORMAL LOW (ref 36.0–46.0)
Hemoglobin: 9.6 g/dL — ABNORMAL LOW (ref 12.0–15.0)
MCH: 27.1 pg (ref 26.0–34.0)
MCV: 77.1 fL — ABNORMAL LOW (ref 78.0–100.0)
RBC: 3.54 MIL/uL — ABNORMAL LOW (ref 3.87–5.11)
WBC: 17.5 10*3/uL — ABNORMAL HIGH (ref 4.0–10.5)

## 2013-08-11 MED ORDER — INFLUENZA VAC SPLIT QUAD 0.5 ML IM SUSP
0.5000 mL | INTRAMUSCULAR | Status: AC
  Administered 2013-08-12: 0.5 mL via INTRAMUSCULAR
  Filled 2013-08-11: qty 0.5

## 2013-08-11 MED ORDER — SODIUM CHLORIDE 0.9 % IV SOLN
2.0000 g | Freq: Four times a day (QID) | INTRAVENOUS | Status: AC
  Administered 2013-08-11 (×4): 2 g via INTRAVENOUS
  Filled 2013-08-11 (×4): qty 2000

## 2013-08-11 MED ORDER — TETANUS-DIPHTH-ACELL PERTUSSIS 5-2.5-18.5 LF-MCG/0.5 IM SUSP
0.5000 mL | Freq: Once | INTRAMUSCULAR | Status: AC
  Administered 2013-08-11: 0.5 mL via INTRAMUSCULAR
  Filled 2013-08-11: qty 0.5

## 2013-08-11 MED ORDER — CEFAZOLIN SODIUM 1-5 GM-% IV SOLN: 1.0000 g | INTRAVENOUS | Status: DC

## 2013-08-11 MED ORDER — DIPHENHYDRAMINE HCL 50 MG/ML IJ SOLN
12.5000 mg | INTRAMUSCULAR | Status: DC | PRN
  Administered 2013-08-11: 12.5 mg via INTRAVENOUS
  Filled 2013-08-11: qty 1

## 2013-08-11 MED ORDER — FENTANYL CITRATE 0.05 MG/ML IJ SOLN
INTRAMUSCULAR | Status: AC
  Filled 2013-08-11: qty 2

## 2013-08-11 MED ORDER — NALBUPHINE SYRINGE 5 MG/0.5 ML
5.0000 mg | INJECTION | INTRAMUSCULAR | Status: DC | PRN
  Administered 2013-08-11: 10 mg via INTRAVENOUS
  Filled 2013-08-11: qty 1

## 2013-08-11 MED ORDER — OXYCODONE-ACETAMINOPHEN 5-325 MG PO TABS
1.0000 | ORAL_TABLET | ORAL | Status: DC | PRN
  Administered 2013-08-12: 2 via ORAL
  Administered 2013-08-12 (×2): 1 via ORAL
  Administered 2013-08-13 (×2): 2 via ORAL
  Filled 2013-08-11: qty 1
  Filled 2013-08-11 (×2): qty 2
  Filled 2013-08-11: qty 1
  Filled 2013-08-11: qty 2
  Filled 2013-08-11: qty 1

## 2013-08-11 MED ORDER — DIPHENHYDRAMINE HCL 25 MG PO CAPS
25.0000 mg | ORAL_CAPSULE | ORAL | Status: DC | PRN
  Administered 2013-08-12 (×2): 25 mg via ORAL
  Filled 2013-08-11 (×2): qty 1

## 2013-08-11 MED ORDER — ONDANSETRON HCL 4 MG/2ML IJ SOLN: 4.0000 mg | INTRAMUSCULAR | Status: DC | PRN

## 2013-08-11 MED ORDER — NALOXONE HCL 1 MG/ML IJ SOLN
1.0000 ug/kg/h | INTRAVENOUS | Status: DC | PRN
  Filled 2013-08-11: qty 2

## 2013-08-11 MED ORDER — SIMETHICONE 80 MG PO CHEW: 80.0000 mg | CHEWABLE_TABLET | ORAL | Status: DC

## 2013-08-11 MED ORDER — DIPHENHYDRAMINE HCL 50 MG/ML IJ SOLN: 25.0000 mg | INTRAMUSCULAR | Status: DC | PRN

## 2013-08-11 MED ORDER — GENTAMICIN SULFATE 40 MG/ML IJ SOLN
1.5000 mg/kg | Freq: Three times a day (TID) | INTRAVENOUS | Status: AC
  Administered 2013-08-11 (×3): 150 mg via INTRAVENOUS
  Filled 2013-08-11 (×3): qty 3.71

## 2013-08-11 MED ORDER — MENTHOL 3 MG MT LOZG: 1.0000 | LOZENGE | OROMUCOSAL | Status: DC | PRN

## 2013-08-11 MED ORDER — ONDANSETRON HCL 4 MG/2ML IJ SOLN: 4.0000 mg | Freq: Three times a day (TID) | INTRAMUSCULAR | Status: DC | PRN

## 2013-08-11 MED ORDER — SIMETHICONE 80 MG PO CHEW
80.0000 mg | CHEWABLE_TABLET | Freq: Three times a day (TID) | ORAL | Status: DC
  Administered 2013-08-11 – 2013-08-13 (×8): 80 mg via ORAL

## 2013-08-11 MED ORDER — SENNOSIDES-DOCUSATE SODIUM 8.6-50 MG PO TABS
2.0000 | ORAL_TABLET | ORAL | Status: DC
  Administered 2013-08-12 (×2): 2 via ORAL

## 2013-08-11 MED ORDER — IBUPROFEN 600 MG PO TABS
600.0000 mg | ORAL_TABLET | Freq: Four times a day (QID) | ORAL | Status: DC
  Administered 2013-08-11 – 2013-08-13 (×9): 600 mg via ORAL
  Filled 2013-08-11 (×9): qty 1

## 2013-08-11 MED ORDER — METOCLOPRAMIDE HCL 5 MG/ML IJ SOLN: 10.0000 mg | Freq: Three times a day (TID) | INTRAMUSCULAR | Status: DC | PRN

## 2013-08-11 MED ORDER — NALBUPHINE SYRINGE 5 MG/0.5 ML
5.0000 mg | INJECTION | INTRAMUSCULAR | Status: DC | PRN
  Administered 2013-08-11: 10 mg via SUBCUTANEOUS
  Filled 2013-08-11 (×3): qty 1

## 2013-08-11 MED ORDER — ONDANSETRON HCL 4 MG PO TABS: 4.0000 mg | ORAL_TABLET | ORAL | Status: DC | PRN

## 2013-08-11 MED ORDER — DIPHENHYDRAMINE HCL 25 MG PO CAPS
25.0000 mg | ORAL_CAPSULE | Freq: Four times a day (QID) | ORAL | Status: DC | PRN
  Administered 2013-08-13: 25 mg via ORAL
  Filled 2013-08-11: qty 1

## 2013-08-11 MED ORDER — WITCH HAZEL-GLYCERIN EX PADS: 1.0000 "application " | MEDICATED_PAD | CUTANEOUS | Status: DC | PRN

## 2013-08-11 MED ORDER — LANOLIN HYDROUS EX OINT: 1.0000 "application " | TOPICAL_OINTMENT | CUTANEOUS | Status: DC | PRN

## 2013-08-11 MED ORDER — SIMETHICONE 80 MG PO CHEW
80.0000 mg | CHEWABLE_TABLET | ORAL | Status: DC | PRN
  Administered 2013-08-12: 80 mg via ORAL

## 2013-08-11 MED ORDER — OXYTOCIN 40 UNITS IN LACTATED RINGERS INFUSION - SIMPLE MED: 62.5000 mL/h | INTRAVENOUS | Status: AC

## 2013-08-11 MED ORDER — DIBUCAINE 1 % RE OINT: 1.0000 "application " | TOPICAL_OINTMENT | RECTAL | Status: DC | PRN

## 2013-08-11 MED ORDER — PRENATAL MULTIVITAMIN CH
1.0000 | ORAL_TABLET | Freq: Every day | ORAL | Status: DC
  Administered 2013-08-11 – 2013-08-13 (×3): 1 via ORAL
  Filled 2013-08-11 (×4): qty 1

## 2013-08-11 MED ORDER — LACTATED RINGERS IV SOLN
INTRAVENOUS | Status: DC
  Administered 2013-08-11 (×2): via INTRAVENOUS

## 2013-08-11 MED ORDER — SODIUM CHLORIDE 0.9 % IJ SOLN: 3.0000 mL | INTRAMUSCULAR | Status: DC | PRN

## 2013-08-11 MED ORDER — NALOXONE HCL 0.4 MG/ML IJ SOLN: 0.4000 mg | INTRAMUSCULAR | Status: DC | PRN

## 2013-08-11 NOTE — Anesthesia Postprocedure Evaluation (Signed)
  Anesthesia Post-op Note  Anesthesia Post Note  Patient: Lacey Schneider  Procedure(s) Performed: Procedure(s) (LRB): Primary CESAREAN SECTION of baby  girl at 2107 APGAR 3/8  (N/A)  Anesthesia type: Spinal  Patient location: Mother/Baby  Post pain: Pain level controlled  Post assessment: Post-op Vital signs reviewed  Last Vitals:  Filed Vitals:   08/11/13 0708  BP: 106/58  Pulse: 80  Temp: 36.4 C  Resp: 20    Post vital signs: Reviewed  Level of consciousness: awake  Complications: No apparent anesthesia complications

## 2013-08-11 NOTE — Progress Notes (Signed)
UR chart review completed.  

## 2013-08-11 NOTE — Progress Notes (Signed)
08/11/13 1500  Clinical Encounter Type  Visited With Patient and family together (husband [place])  Visit Type Follow-up  Referral From Chaplain  Consult/Referral To Social work (per [employer] request for bus pass)  Spiritual Encounters  Spiritual Needs Emotional  Stress Factors  Patient Stress Factors Financial concerns  Family Stress Factors Financial concerns   Followed up with Ms Stoltzfus and husband Genevie Cheshire to offer spiritual and emotional support.  Per pt, she is doing so much better today after a very stressful day yesterday.  Knowing that she and baby Faith are both fine has helped her relax considerably.  Both parents celebrated that Rushie Goltz has been quite alert (looking at TV) and that she seems to cry only when hungry.  Family is Muslim, and desire for female-only caregivers is part of their faith practice.   Provided spiritual presence, invitation to share and process their story, info about ongoing chaplain availability.  Please page as needed for further support.  [address] [place], [place] [phone]

## 2013-08-11 NOTE — Progress Notes (Addendum)
Subjective: Postpartum Day 1: Cesarean Delivery Patient reports incisional pain, tolerating PO, + flatus and no problems voiding.  Not moving around yet. Has been drinking liquids but has ordered breakfast.   Objective: Vital signs in last 24 hours: Temp:  [98 F (36.7 C)-103.2 F (39.6 C)] 98.9 F (37.2 C) (09/16 0446) Pulse Rate:  [63-110] 90 (09/16 0446) Resp:  [8-25] 20 (09/16 0446) BP: (94-148)/(45-99) 98/61 mmHg (09/16 0446) SpO2:  [93 %-100 %] 95 % (09/16 0446)  Physical Exam:  General: alert, cooperative and no distress Lochia: appropriate Uterine Fundus: firm Incision: healing well, no significant drainage, no dehiscence, no significant erythema DVT Evaluation: No evidence of DVT seen on physical exam.   Recent Labs  08/09/13 2045 08/11/13 0540  HGB 12.5 9.6*  HCT 35.3* 27.3*    Assessment/Plan: Status post Cesarean section. Doing well postoperatively.  Continue current care. Progesterone only pills for contraception Breast feeding- lactation consultant today  AMP and Gent to stop today for chorio tx  Lacey Schneider L 08/11/2013, 7:05 AM

## 2013-08-11 NOTE — Clinical Social Work Maternal (Signed)
Clinical Social Work Department  PSYCHOSOCIAL ASSESSMENT - MATERNAL/CHILD  08/11/2013  Patient: Lacey Schneider,Lacey Schneider Account Number: [id number] Admit Date: 08/09/2013  Childs Name:  Lacey Schneider   Clinical Social Worker: Chenay Nesmith, LCSW Date/Time: 08/11/2013 06:46 PM  Date Referred: 08/11/2013  Referral source   CN    Referred reason   LPNC   Other referral source:  I: FAMILY / HOME ENVIRONMENT  Child's legal guardian: PARENT  Guardian - Name  Guardian - Age  Guardian - Address   [name]  27  [address] [place], [place] [address]   [name], [name]   (same as above)   Other household support members/support persons  Other support:  II PSYCHOSOCIAL DATA  Information Source: Patient Interview  Financial and Community Resources  Employment:  Financial resources: Medicaid  If Medicaid - County: [place]  Other   WIC   School / Grade:  Maternity Care Coordinator / Child Services Coordination / Early Interventions:  Monica Surgeon   Cultural issues impacting care:  III STRENGTHS  Strengths   Adequate Resources   Home prepared for Child (including basic supplies)   Supportive family/friends   Strength comment:  IV RISK FACTORS AND CURRENT PROBLEMS  Current Problem: YES  Risk Factor & Current Problem  Patient Issue  Family Issue  Risk Factor / Current Problem Comment   Other - See comment  Y  N  LPNC @ 33 weeks   Housing Concerns  Y  N  Currently living in hotel   V SOCIAL WORK ASSESSMENT  CSW met with pt in her 1st floor hospital room to assess reason for LPNC @ 33 weeks. Pt told this CSW that she started PNC in January '14, in [place] @ [facility]. She seemed upset that her records were not transferred & explained that she signed a release. Pt attended [place]NC appointments in [place] until she & FOB relocated to [place]. Pt was not willing to tell this CSW the reason they moved to [place], [place] or when. While the couple was in [place] she was unable to  establish PNC because she states she was denied care. The couple then moved to the area around the end on June '14 to be with FOB's family. Pt explained that his family planned to help them find housing here but never followed through. The couple has lived at [facility], a homeless shelter for single women & men since their arrival. When they left that shelter (CSW unclear if voluntary) they moved into a motel on [address]. The pt told CSW that they had to leave that place due to mold infestation & risk of lead poisoning. Currently, the couple lives at the [facility], where they have lived for 2 weeks. [facility] once they learned of resources in the area. Pt denies any history of illegal substance use however seemed concerned about the drug testing results after CSW explained policy. UDS & meconium collection pending. CSW spoke with pt's assigned OBCM, Monica Surgeon to discuss this family & any concerns. Monica verified that the couple has a room at the [facility] however seemed to have some concerns about their instability. The pt has 3 other children (ages 8, 7, 6), who are in the custody of their father in [place] Pt seemed defensive during the assessment & questions the purpose of consult. CSW explained the reason for the consult, again. CSW was not able to determine if CPS   was in involved with the family. The pt started to tell CSW how unhappy she is with the services she had received since being a pt here & accused CSW of coming in their room after FOB asked for a bus pass. This CSW was not aware of FOB's request. Pt's thought process was difficult to follow. Pt states she has all the necessary supplies for the infant. She acknowledged some depressed moods during the pregnancy due to their housing situation. No SI. The pt is unemployed. FOB receives SSI however CSW was not able to determine reason. FOB was present during assessment but did not participate in conversation.  This CSW is concerned about this family's unstable housing situation & possible unknown mental illness(es) diagnoses that could pose a risk of abuse or neglect to the infant, once discharge. CSW reported case to [place] CPS & will make a CC4C referral to ensure appropriate follow up. CSW provided the FOB with 2 bus passes,l upon request. CSW made aware of parents disagreement with medical teams recommendations, this evening, which adds to concerns.   VI SOCIAL WORK PLAN  Social Work Plan   No Further Intervention Required / No Barriers to Discharge   Child Protective Services Report   Type of pt/family education:  If child protective services report - county:  If child protective services report - date:  Information/referral to community resources comment:  Other social work plan:     

## 2013-08-12 ENCOUNTER — Encounter (HOSPITAL_COMMUNITY): Payer: Self-pay | Admitting: Obstetrics and Gynecology

## 2013-08-12 MED ORDER — HYDROCORTISONE 1 % EX CREA
TOPICAL_CREAM | Freq: Three times a day (TID) | CUTANEOUS | Status: DC | PRN
  Administered 2013-08-12: via TOPICAL
  Filled 2013-08-12: qty 28

## 2013-08-12 NOTE — Progress Notes (Signed)
Subjective: Postpartum Day #2: Cesarean Delivery Patient reports tolerating PO; amb without difficulty; felt dizzy while trying to have a BM on toilet, but doing better now; infant in NICU; pumping milk; plans on OCPs for contraception  Objective: Vital signs in last 24 hours: Temp:  [97.4 F (36.3 C)-98.7 F (37.1 C)] 98.7 F (37.1 C) (09/17 2212) Pulse Rate:  [68-81] 70 (09/17 2212) Resp:  [16-17] 16 (09/17 2212) BP: (111-120)/(56-70) 120/56 mmHg (09/17 2212) SpO2:  [99 %] 99 % (09/17 2212)  Physical Exam:  General: alert, cooperative and mild distress Heart: RRR Lungs: nl effort Lochia: appropriate Uterine Fundus: firm Incision: healing well, no significant drainage DVT Evaluation: No evidence of DVT seen on physical exam.   Recent Labs  08/11/13 0540  HGB 9.6*  HCT 27.3*    Assessment/Plan: Status post Cesarean section. Doing well postoperatively.  Continue current care.  Cam Hai 08/12/2013, 10:57 PM

## 2013-08-12 NOTE — Progress Notes (Signed)
08/12/13 1100  Clinical Encounter Type  Visited With Patient  Visit Type Follow-up;Spiritual support;Social support  Spiritual Encounters  Spiritual Needs Emotional  Stress Factors  Patient Stress Factors (unexpected NICU admission)   Provided opportunity for Ms Westergaard to share and process her feelings about baby Faith's unexpected NICU admission.  Per pt, she is feeling guilty about the blood type differences between her and her baby because she feels like she's contributing to her baby's "suffering on her second day on the earth." To process the guilt, we talked about how hard it is to help our hearts feel what our brains know to to be true.  Ms Mclaine also stated that her three older children live with their grandmother in [place]; she and Billy plan to move the children down with them here in  once they get settled with home and work.  Pt appreciative of visit and aware of ongoing chaplain availability for support as desired.  [address] [place], [place] [phone]

## 2013-08-12 NOTE — Lactation Note (Signed)
This note was copied from the chart of [name]. Lactation Consultation Note  I attempted to see this mom of a NICU baby this evening, but both parents were sound asleep, snoring. As per mom's nurse, mom is not being compliant with pumping, and only visited the NICU once today. I asked that the night nurse make sure mom knows she can breast feed her baby in the nICU. I will follow up with this family tomorrow.   Patient Name: [name] RUEAV'W Date: 08/12/2013 Reason for consult: Follow-up assessment;NICU baby   Maternal Data    Feeding Feeding Type: Breast Milk Length of feed: 15 min  LATCH Score/Interventions                      Lactation Tools Discussed/Used     Consult Status Consult Status: Follow-up Date: 08/13/13 Follow-up type: In-patient    Alfred Levins 08/12/2013, 7:13 PM

## 2013-08-13 MED ORDER — IBUPROFEN 600 MG PO TABS: 600.0000 mg | ORAL_TABLET | Freq: Four times a day (QID) | ORAL | Status: AC

## 2013-08-13 MED ORDER — NORETHINDRONE 0.35 MG PO TABS: ORAL_TABLET | ORAL | Status: AC

## 2013-08-13 MED ORDER — OXYCODONE-ACETAMINOPHEN 5-325 MG PO TABS: 1.0000 | ORAL_TABLET | ORAL | Status: AC | PRN

## 2013-08-13 NOTE — Progress Notes (Signed)
CPS will not be providing services to this family, as case was not accepted.

## 2013-08-13 NOTE — Progress Notes (Signed)
Patient returned to room after visit to NICU. Pain reassessed. Attempted to complete discharge paperwork. Patient states she is not leaving the hospital tonight. Secured bus passes for patient and FOB.

## 2013-08-13 NOTE — Discharge Summary (Signed)
Attestation of Attending Supervision of Advanced Practitioner: Evaluation and management procedures were performed by the PA/NP/CNM/OB Fellow under my supervision/collaboration. Chart reviewed and agree with management and plan.  Davona Kinoshita V 08/13/2013 11:30 PM   

## 2013-08-13 NOTE — Discharge Summary (Signed)
Obstetric Discharge Summary Reason for Admission: induction of labor for postdates Prenatal Procedures: none Intrapartum Procedures: cesarean: low cervical, transverse for NRFHR Postpartum Procedures: antibiotics- Amp and Natasha Bence began in labor due to fever of 103 and continued through POD#1 Complications-Operative and Postpartum: chorioamnionitis Hemoglobin  Date Value Range Status  08/11/2013 9.6* 12.0 - 15.0 g/dL Final     DELTA CHECK NOTED     REPEATED TO VERIFY     HCT  Date Value Range Status  08/11/2013 27.3* 36.0 - 46.0 % Final   Lacey Schneider is a 27yo [id number] at 41.2wks who was admitted in the afternoon of 9/14 and started with the induction process.  She received a foley bulb placement and Pitocin. By the evening of 9/15 she began to have fetal heartrate concerns and her cx had remained at 6cm all day. She also had a fever for which Amp and Natasha Bence were given. It was recommended that she proceed with a C/S due to FHR decelerations. Her postop stay has been essentially unremarkable. The infant is in NICU for eval of jaundice concerns. The pt has been followed by SW due to unstable housing situation as well as other concerns; CPS referral has been made. The pt is deemed to have received the full benefit of her hospital stay and will be discharged home today.  Physical Exam:  General: alert, cooperative and mild distress Lochia: appropriate Uterine Fundus: firm Incision: healing well, no significant drainage DVT Evaluation: No evidence of DVT seen on physical exam.  Discharge Diagnoses: Term Pregnancy-delivered and Amnionitis  Discharge Information: Date: 08/13/2013 Activity: pelvic rest Diet: routine Medications: PNV, Ibuprofen, Percocet and Micronor to start 09/13/13 Condition: stable Instructions: refer to practice specific booklet Discharge to: home Follow-up Information   Follow up with [facility] [place]. (Make an appointment for 4-6 weeks)    Contact information:   [address] [place] [address] [phone]      Newborn Data: Live born female  Birth Weight: 7 lb (3175 g) APGAR: 3, 8  Infant in NICU being tx for jaundice. Is pumping milk and plans to use Micronor for contraception.   Cam Hai 08/13/2013, 7:44 AM

## 2013-08-13 NOTE — Progress Notes (Signed)
Patient called to room; complains of right sided headache, especially when rising from the commode, unrelieved by Motrin. MD notified; vital signs within normal limits. No new orders received at this time. Per MD okay to medicate with Percocet and reassess pain within one hour.

## 2013-08-14 ENCOUNTER — Inpatient Hospital Stay (HOSPITAL_COMMUNITY)
Admission: AD | Admit: 2013-08-14 | Discharge: 2013-08-14 | Disposition: A | Discharge status: Home / Self Care | Payer: Medicaid Other | Source: Ambulatory Visit | Attending: Obstetrics & Gynecology | Admitting: Obstetrics & Gynecology

## 2013-08-14 ENCOUNTER — Encounter (HOSPITAL_COMMUNITY): Payer: Self-pay | Admitting: *Deleted

## 2013-08-14 DIAGNOSIS — O99893 Other specified diseases and conditions complicating puerperium: Secondary | ICD-10-CM | POA: Insufficient documentation

## 2013-08-14 DIAGNOSIS — M545 Low back pain, unspecified: Secondary | ICD-10-CM | POA: Insufficient documentation

## 2013-08-14 DIAGNOSIS — M549 Dorsalgia, unspecified: Secondary | ICD-10-CM

## 2013-08-14 LAB — URINALYSIS, ROUTINE W REFLEX MICROSCOPIC
Protein, ur: NEGATIVE mg/dL
Urobilinogen, UA: 0.2 mg/dL (ref 0.0–1.0)

## 2013-08-14 LAB — URINE MICROSCOPIC-ADD ON

## 2013-08-14 MED ORDER — CYCLOBENZAPRINE HCL 10 MG PO TABS: 10.0000 mg | ORAL_TABLET | Freq: Once | ORAL | Status: AC

## 2013-08-14 MED ORDER — CYCLOBENZAPRINE HCL 10 MG PO TABS
10.0000 mg | ORAL_TABLET | Freq: Once | ORAL | Status: AC
  Administered 2013-08-14: 10 mg via ORAL
  Filled 2013-08-14: qty 1

## 2013-08-14 MED ORDER — OXYCODONE-ACETAMINOPHEN 5-325 MG PO TABS
1.0000 | ORAL_TABLET | Freq: Once | ORAL | Status: AC
  Administered 2013-08-14: 1 via ORAL
  Filled 2013-08-14: qty 1

## 2013-08-14 NOTE — MAU Note (Signed)
Pt reports sever lower back pain since 0300.

## 2013-08-14 NOTE — MAU Provider Note (Signed)
None     Chief Complaint:  Back Pain   Lacey Schneider is  27 y.o. [id number].  She was discharged home 12 hours ago s/p c/s.  She walked to and from the bus stop, was upstairs bf her baby in the NICU, and started having lower back pain. Did not pick up her medicines today. History reviewed. No pertinent past medical history.  Past Surgical History  Procedure Laterality Date  . Tonsillectomy    . Addnoids    . Tympanostomy tube placement    . Revision of scar on face/head    . Cesarean section N/A 08/10/2013    Procedure: Primary CESAREAN SECTION of baby  girl at 2107 APGAR 3/8 ;  Surgeon: Catalina Antigua, MD;  Location: [facility];  Service: Obstetrics;  Laterality: N/A;    No family history on file.  History  Substance Use Topics  . Smoking status: Former Games developer  . Smokeless tobacco: Never Used  . Alcohol Use: No    Allergies: No Known Allergies  Prescriptions prior to admission  Medication Sig Dispense Refill  . ibuprofen (ADVIL,MOTRIN) 600 MG tablet Take 1 tablet (600 mg total) by mouth every 6 (six) hours.  50 tablet  1  . norethindrone (ORTHO MICRONOR) 0.35 MG tablet Start taking one tab PO QD on 09/13/13.  1 Package  11  . oxyCODONE-acetaminophen (PERCOCET/ROXICET) 5-325 MG per tablet Take 1-2 tablets by mouth every 4 (four) hours as needed.  50 tablet  0  . Prenatal Vit-Fe Fumarate-FA (PRENATAL MULTIVITAMIN) TABS tablet Take 1 tablet by mouth daily at 12 noon.         Review of Systems   Constitutional: Negative for fever and chills Eyes: Negative for visual disturbances Respiratory: Negative for shortness of breath, dyspnea Cardiovascular: Negative for chest pain or palpitations  Gastrointestinal: Negative for vomiting, diarrhea and constipation Genitourinary: Negative for dysuria and urgency Musculoskeletal: Positive for back pain Neurological: Negative for dizziness and headaches     Physical Exam   Blood pressure 125/69, pulse 85, temperature 99.2 F (37.3 C),  resp. rate 22, SpO2 100.00%.  General: General appearance - alert, well appearing, and in no distress Chest - clear to auscultation, no wheezes, rales or rhonchi, symmetric air entry Heart - normal rate and regular rhythm Abdomen - soft, nontender, nondistended, no masses or organomegaly.  Incision C/D without erythema. + BS Back:  Lower back tender to touch:  Very dramatic at first with exam:  Yelling when my fingers lightly grazed her skin on her back. After letting pt know that it would be more helpful if she remained calm and let me examine her, it appears that she is having musculoskeletal pain Pelvic - examination not indicated Extremities - pedal edema 1 +   Labs: No results found for this or any previous visit (from the past 24 hour(s)). Imaging Studies:  No results found.   Assessment: Back pain  Plan: Flexeril. Pt to pick up  Her pain med rx and take them as prescribed. Heat and/or ice prn  CRESENZO-DISHMAN,Elizeo Rodriques

## 2013-08-16 ENCOUNTER — Inpatient Hospital Stay (HOSPITAL_COMMUNITY): Admission: RE | Admit: 2013-08-16 | Payer: MEDICAID | Source: Ambulatory Visit

## 2014-02-09 ENCOUNTER — Encounter (HOSPITAL_COMMUNITY): Payer: Self-pay | Admitting: Emergency Medicine

## 2014-02-09 ENCOUNTER — Emergency Department (HOSPITAL_COMMUNITY)
Admission: EM | Admit: 2014-02-09 | Discharge: 2014-02-09 | Discharge status: Against Medical Advice | Payer: Medicaid - Out of State | Attending: Emergency Medicine | Admitting: Emergency Medicine

## 2014-02-09 DIAGNOSIS — Z202 Contact with and (suspected) exposure to infections with a predominantly sexual mode of transmission: Secondary | ICD-10-CM | POA: Insufficient documentation

## 2014-02-09 DIAGNOSIS — Z87891 Personal history of nicotine dependence: Secondary | ICD-10-CM | POA: Insufficient documentation

## 2014-02-09 LAB — POC URINE PREG, ED: PREG TEST UR: NEGATIVE

## 2014-02-09 LAB — URINALYSIS, ROUTINE W REFLEX MICROSCOPIC
BILIRUBIN URINE: NEGATIVE
Glucose, UA: NEGATIVE mg/dL
Hgb urine dipstick: NEGATIVE
KETONES UR: NEGATIVE mg/dL
Leukocytes, UA: NEGATIVE
NITRITE: NEGATIVE
PH: 5.5 (ref 5.0–8.0)
PROTEIN: NEGATIVE mg/dL
Specific Gravity, Urine: 1.026 (ref 1.005–1.030)
Urobilinogen, UA: 0.2 mg/dL (ref 0.0–1.0)

## 2014-02-09 NOTE — ED Notes (Signed)
Called x's 3 without response 

## 2014-02-09 NOTE — ED Notes (Signed)
Pt is here to get tested for STD because her sex partner has been having penile discharge.  She has no symptoms.

## 2014-08-31 IMAGING — US US OB DETAIL+14 WK
2 series · 12 of 28 positions shown · non-contrast
Comparison: none

[Series 1: us ob detail +14 wk · 1 of 8 slices shown (1 of 2)]
[im 8/8]
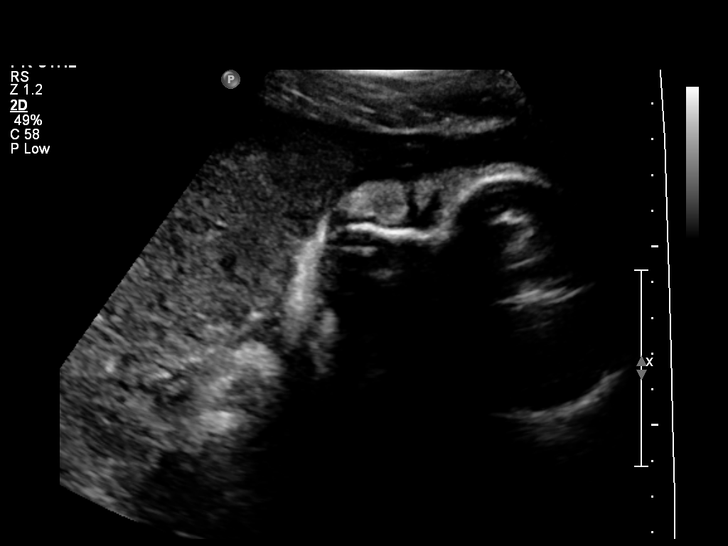

[Series 1: us ob detail +14 wk · 11 of 88 slices shown (2 of 2)]
[im 4/88]
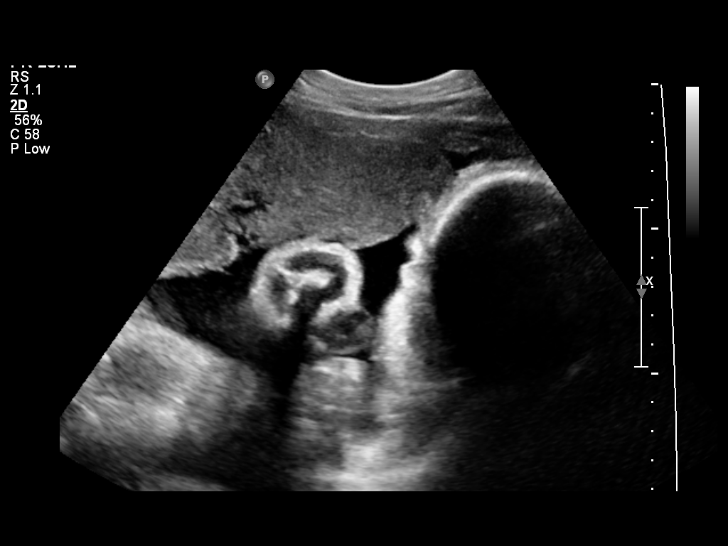
[im 11/88]
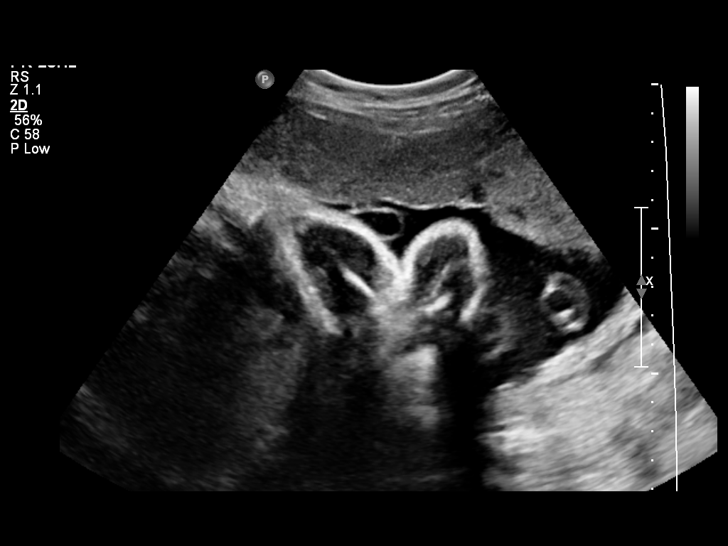
[im 21/88]
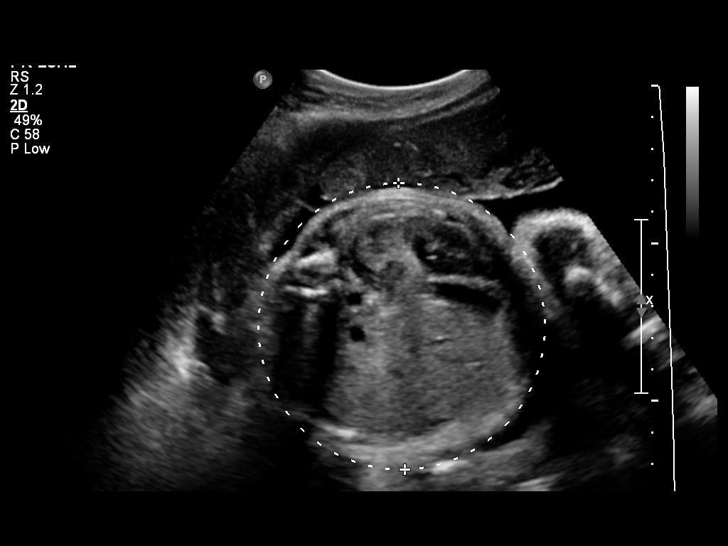
[im 28/88]
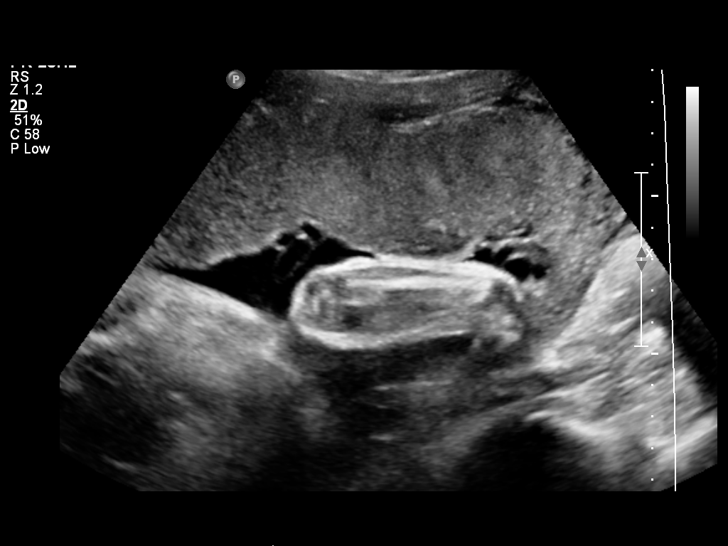
[im 35/88]
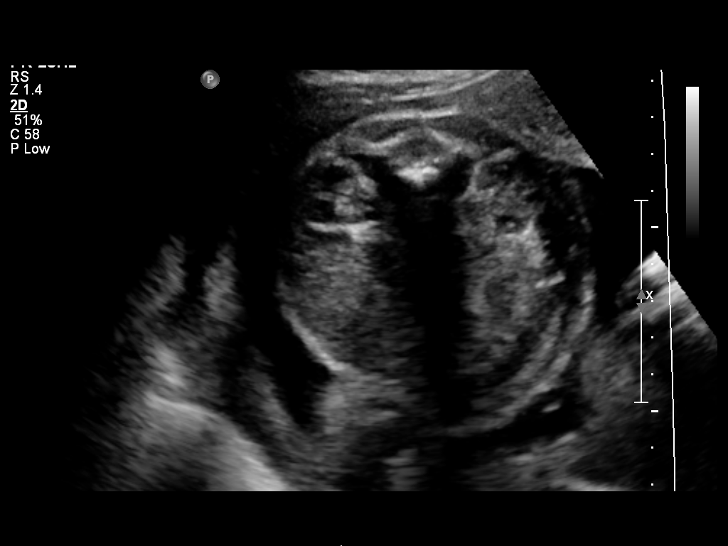
[im 46/88]
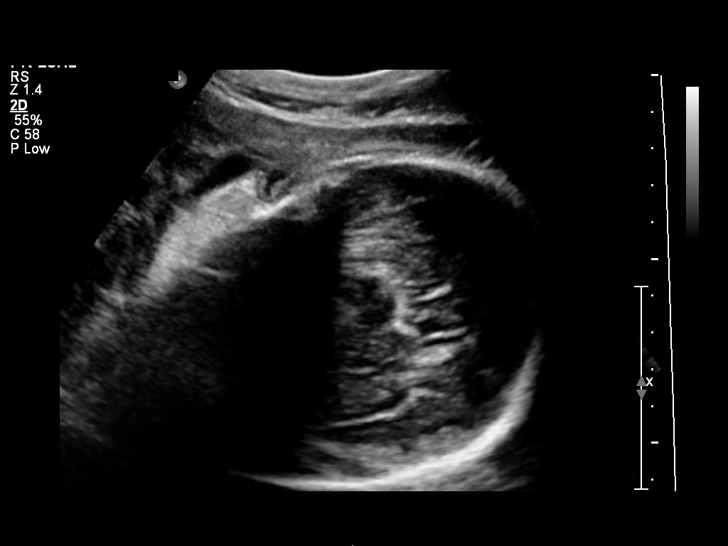
[im 53/88]
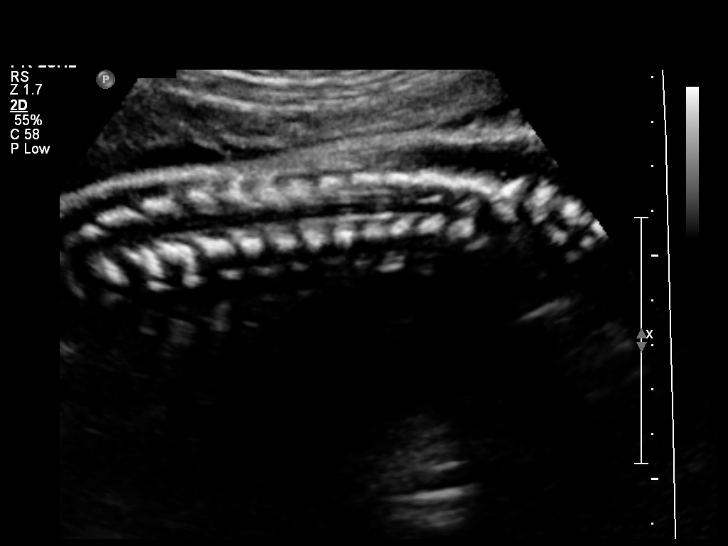
[im 60/88]
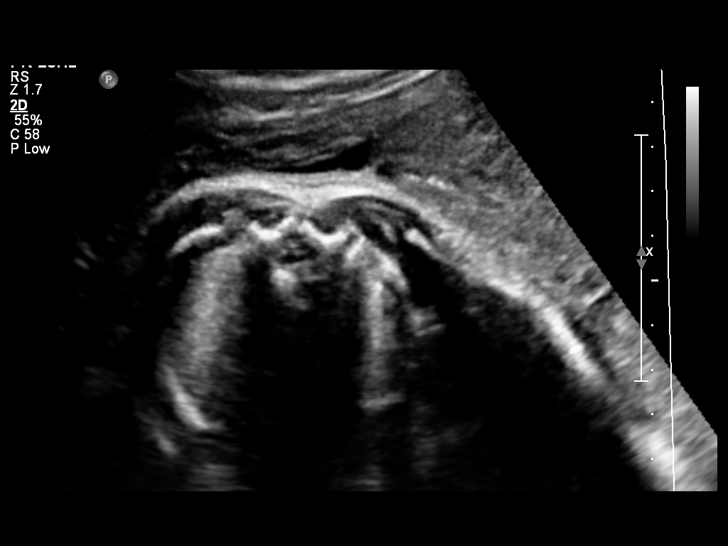
[im 70/88]
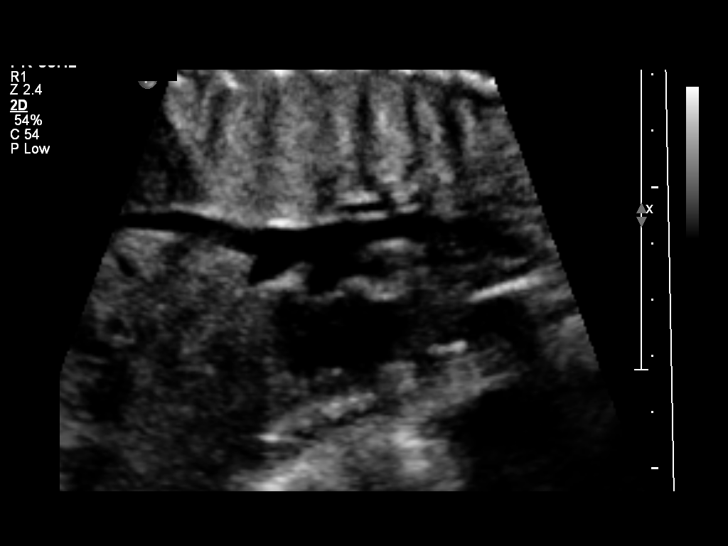
[im 77/88]
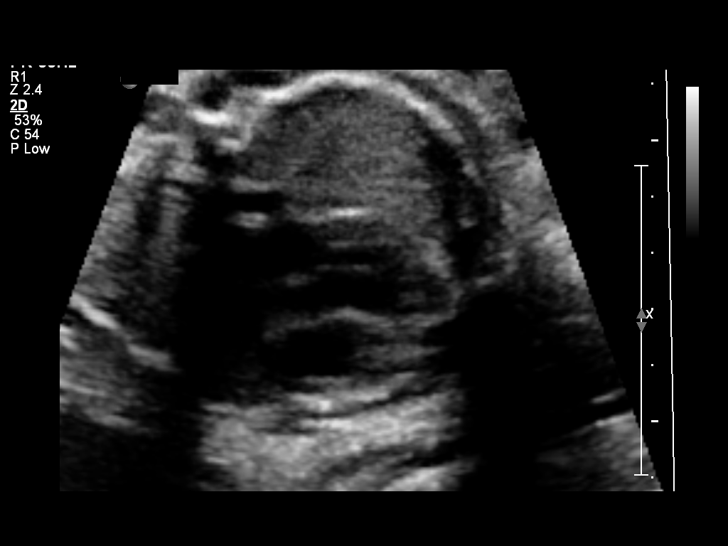
[im 84/88]
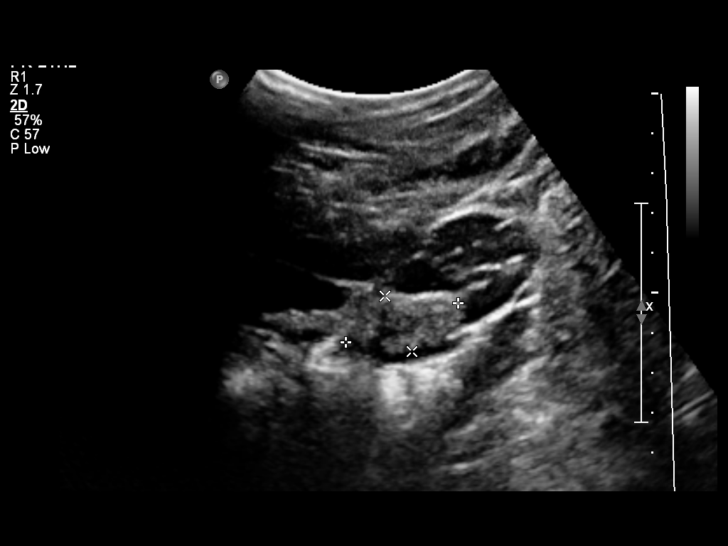

[12 of 28 positions shown; findings below may reference images not displayed]

OBSTETRICS REPORT
                      (Signed Final 06/16/2013 [DATE])

                                                         Faculty Physician
Service(s) Provided

 US OB DETAIL + 14 WK                                  76811.0
Indications

 Detailed fetal anatomic survey
 Poor obstetric history: Previous preterm delivery 33
 wks
Fetal Evaluation

 Num Of Fetuses:    1
 Fetal Heart Rate:  144                         bpm
 Cardiac Activity:  Observed
 Presentation:      Cephalic
 Placenta:          Anterior, above cervical os
 P. Cord            Visualized, central
 Insertion:

 Amniotic Fluid
 AFI FV:      Subjectively within normal limits
 AFI Sum:     18.91   cm      70   %Tile     Larg Pckt:   6.14   cm
 RUQ:   4.25   cm    RLQ:    4.5    cm    LUQ:   6.14    cm   LLQ:    4.02   cm
Biometry

 BPD:       81  mm    G. Age:   32w 4d                CI:        72.04   70 - 86
                                                      FL/HC:      20.8   19.9 -

 HC:     303.7  mm    G. Age:   33w 5d       23  %    HC/AC:      1.10   0.96 -

 AC:     276.9  mm    G. Age:   31w 6d       11  %    FL/BPD:     77.9   71 - 87
 FL:      63.1  mm    G. Age:   32w 5d       21  %    FL/AC:      22.8   20 - 24
 HUM:     57.8  mm    G. Age:   33w 4d       61  %
 CER:     46.7  mm    G. Age:   N/A        > 95  %

 Est. FW:    1955  gm      4 lb 5 oz     37  %
Gestational Age

 Clinical EDD:  33w 3d                                        EDD:   08/01/13
 U/S Today:     32w 5d                                        EDD:   08/06/13
 Best:          33w 3d    Det. By:   Clinical EDD             EDD:   08/01/13
Anatomy

 Cranium:          Appears normal         Aortic Arch:      Appears normal
 Fetal Cavum:      Appears normal         Ductal Arch:      Not well visualized
 Ventricles:       Appears normal         Diaphragm:        Appears normal
 Choroid Plexus:   Appears normal         Stomach:          Appears normal
 Cerebellum:       Appears normal         Abdomen:          Appears normal
 Posterior Fossa:  Appears normal         Abdominal Wall:   Not well visualized
 Nuchal Fold:      Not applicable (>20    Cord Vessels:     Appears normal (3
                   wks GA)                                  vessel cord)
 Face:             Appears normal         Kidneys:          Appear normal
                   (orbits and profile)
 Lips:             Appears normal         Bladder:          Appears normal
 Heart:            Appears normal         Spine:            Appears normal
                   (4CH, axis, and
                   situs)
 RVOT:             Appears normal         Lower             Appears normal
                                          Extremities:
 LVOT:             Appears normal         Upper             Appears normal
                                          Extremities:

 Other:  Technically difficult due to advanced GA and fetal position.
Cervix Uterus Adnexa

 Cervical Length:   4.5       cm

 Cervix:       Normal appearance by transabdominal scan.

 Left Ovary:   Within normal limits.
 Right Ovary:  Within normal limits.
Impression

 Single intrauterine gestation demonstrating an estimated
 gestational age by ultrasound of 32w 5d. This is correlated
 with expected estimated gestational age by clinical EDD of
 33w 3d. The AC is smaller than the remaining gestational
 indicators at the 11%  resulting in an EFW at the 37%.

 Overall assessment is compromised by advanced gestational
 age and fetal position. Visualized anatomy appears normal.

 No focal placental abnormalities are seen.

 Subjectively and quantitatively normal amniotic fluid volume.

 Normal cervical length and appearance.

 questions or concerns.

## 2014-09-27 ENCOUNTER — Encounter (HOSPITAL_COMMUNITY): Payer: Self-pay | Admitting: Emergency Medicine
